# Patient Record
Sex: Female | Born: 1995 | Race: White | Hispanic: No | Marital: Single | Smoking: Never smoker
Health system: Southern US, Community
[De-identification: ages and names within clinical notes are randomized; demographics above are authoritative.]

---
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
## 2013-10-18 ENCOUNTER — Ambulatory Visit: Payer: Self-pay | Admitting: Family Medicine

## 2014-10-30 ENCOUNTER — Ambulatory Visit: Payer: Self-pay | Admitting: Family Medicine

## 2014-11-02 ENCOUNTER — Ambulatory Visit: Payer: Self-pay | Admitting: Family Medicine

## 2015-06-04 DIAGNOSIS — J029 Acute pharyngitis, unspecified: Secondary | ICD-10-CM | POA: Diagnosis not present

## 2015-08-20 DIAGNOSIS — R6884 Jaw pain: Secondary | ICD-10-CM | POA: Diagnosis not present

## 2015-09-25 DIAGNOSIS — M7581 Other shoulder lesions, right shoulder: Secondary | ICD-10-CM | POA: Diagnosis not present

## 2015-11-28 ENCOUNTER — Encounter: Payer: Self-pay | Admitting: *Deleted

## 2015-11-28 ENCOUNTER — Ambulatory Visit
Admission: EM | Admit: 2015-11-28 | Discharge: 2015-11-28 | Disposition: A | Discharge status: Home / Self Care | Payer: Managed Care, Other (non HMO) | Attending: Family Medicine | Admitting: Family Medicine

## 2015-11-28 DIAGNOSIS — M7541 Impingement syndrome of right shoulder: Secondary | ICD-10-CM | POA: Diagnosis not present

## 2015-11-28 MED ORDER — TRIAMCINOLONE ACETONIDE 40 MG/ML IJ SUSP: 40.0000 mg | Freq: Once | INTRAMUSCULAR | Status: DC

## 2015-11-28 MED ORDER — TRIAMCINOLONE ACETONIDE 40 MG/ML IJ SUSP
40.0000 mg | Freq: Once | INTRAMUSCULAR | Status: AC
  Administered 2015-11-28: 40 mg via INTRA_ARTICULAR

## 2015-11-28 NOTE — ED Notes (Signed)
Right shoulder pain, here for Dr Allena KatzPatel.

## 2015-11-28 NOTE — ED Provider Notes (Signed)
CSN: [id number]     Arrival date & time 11/28/15  1318 History   First MD Initiated Contact with Patient 11/28/15 1415     Chief Complaint  Patient presents with  . Shoulder Pain   (Consider location/radiation/quality/duration/timing/severity/associated sxs/prior Treatment) HPI: Patient presents today with right shoulder pain. [facility] with some right shoulder pain she thinks is due to increased catching. She denies any acute trauma or injury to the shoulder. She denies any tingling or numbness in the right shoulder. She has noticed some discomfort radiating into her pack recently from the increased catching she has been doing. She denies any bruising or swelling of the area. She is interested in getting a steroid injection today to see if she can continue to participate for the next 3 weeks of softball. She has tried Naprosyn and Motrin for her pain with some relief.   History reviewed. No pertinent past medical history. History reviewed. No pertinent past surgical history. History reviewed. No pertinent family history. Social History  Substance Use Topics  . Smoking status: Never Smoker   . Smokeless tobacco: None  . Alcohol Use: Yes   OB History    No data available     Review of Systems: Negative except mentioned above.   Allergies  Review of patient's allergies indicates no known allergies.  Home Medications   Prior to Admission medications   Medication Sig Start Date End Date Taking? Authorizing Provider  meloxicam (MOBIC) 15 MG tablet Take 15 mg by mouth daily.   Yes Historical Provider, MD   Meds Ordered and Administered this Visit   Medications  triamcinolone acetonide (KENALOG-40) injection 40 mg (40 mg Intra-articular Given 11/28/15 1444)    BP 116/68 mmHg  Pulse 62  Temp(Src) 98 F (36.7 C) (Oral)  Resp 16  Ht 5\' 7"  (1.702 m)  Wt 145 lb (65.772 kg)  BMI 22.71 kg/m2  SpO2 100%  LMP 10/28/2015 (Exact  Date) No data found.   Physical Exam   GENERAL: NAD RESP: CTA B CARD: RRR MSK: R Shoulder-mild subacromial tenderness, positive Neer's, positive Hawkins, pain mostly with forward flexion after 90, negative empty can, negative lift off, negative O'Brien's, negative apprehension, NV intact NEURO: CN II-XII grossly intact   ED Course  Procedures (including critical care time)  Labs Review Labs Reviewed - No data to display  Imaging Review No results found.   MDM   1. Impingement syndrome, shoulder, right    Risk/benefits were discussed. Patient consented verbally. Sterile technique was used. Area was cleaned with alcohol, ethyl chloride was used as local anesthetic, 5 cc of lidocaine without epi and 40 mg of Kenalog were used for injection. Posterior lateral approach was taken. Patient tolerated procedure well. Post injection instructions given. She will seek medical attention if she has any problems. I have asked that she not do any throwing or catching today or tomorrow. She can gradually return back to activity on Friday. She'll follow up with myself or [facility].    Jolene ProvostKirtida Cheetara Hoge, MD 11/28/15 [phone]

## 2015-12-27 ENCOUNTER — Ambulatory Visit (INDEPENDENT_AMBULATORY_CARE_PROVIDER_SITE_OTHER): Payer: Managed Care, Other (non HMO) | Admitting: Family Medicine

## 2015-12-27 DIAGNOSIS — M25561 Pain in right knee: Secondary | ICD-10-CM | POA: Diagnosis not present

## 2015-12-27 DIAGNOSIS — M25562 Pain in left knee: Secondary | ICD-10-CM
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
## 2015-12-28 ENCOUNTER — Ambulatory Visit
Admission: RE | Admit: 2015-12-28 | Discharge: 2015-12-28 | Disposition: A | Discharge status: Home / Self Care | Payer: Managed Care, Other (non HMO) | Source: Ambulatory Visit | Attending: Family Medicine | Admitting: Family Medicine

## 2015-12-28 DIAGNOSIS — M25462 Effusion, left knee: Secondary | ICD-10-CM | POA: Diagnosis not present

## 2015-12-28 DIAGNOSIS — M25561 Pain in right knee: Secondary | ICD-10-CM | POA: Insufficient documentation

## 2015-12-28 DIAGNOSIS — M25562 Pain in left knee: Principal | ICD-10-CM

## 2017-08-12 IMAGING — CR DG KNEE COMPLETE 4+V*R*
1 series · 4 of 4 positions shown · non-contrast
Comparison: None in PACs

CLINICAL DATA: Four years of intermittent bilateral anterior knee
pain and grinding; history of patellar tendinitis.

EXAM:
RIGHT KNEE - COMPLETE 4+ VIEW

[Series 1: dg knee complete 4 views right · 0.14mm/px · 4 of 4 slices shown]
[im 1/4]
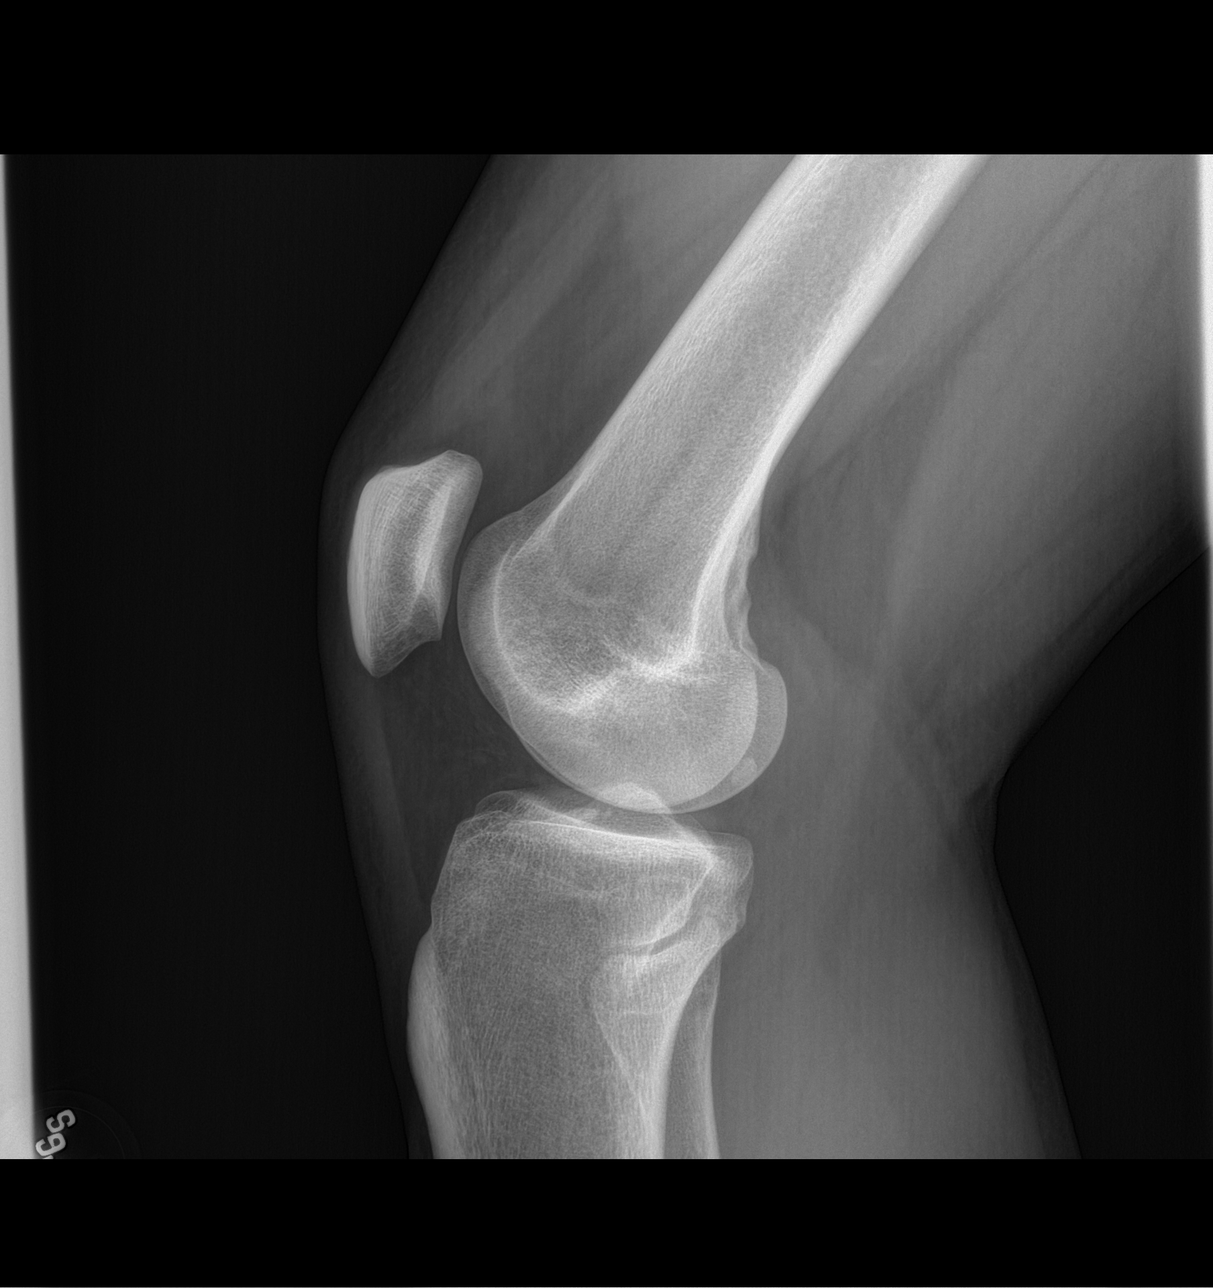
[im 2/4]
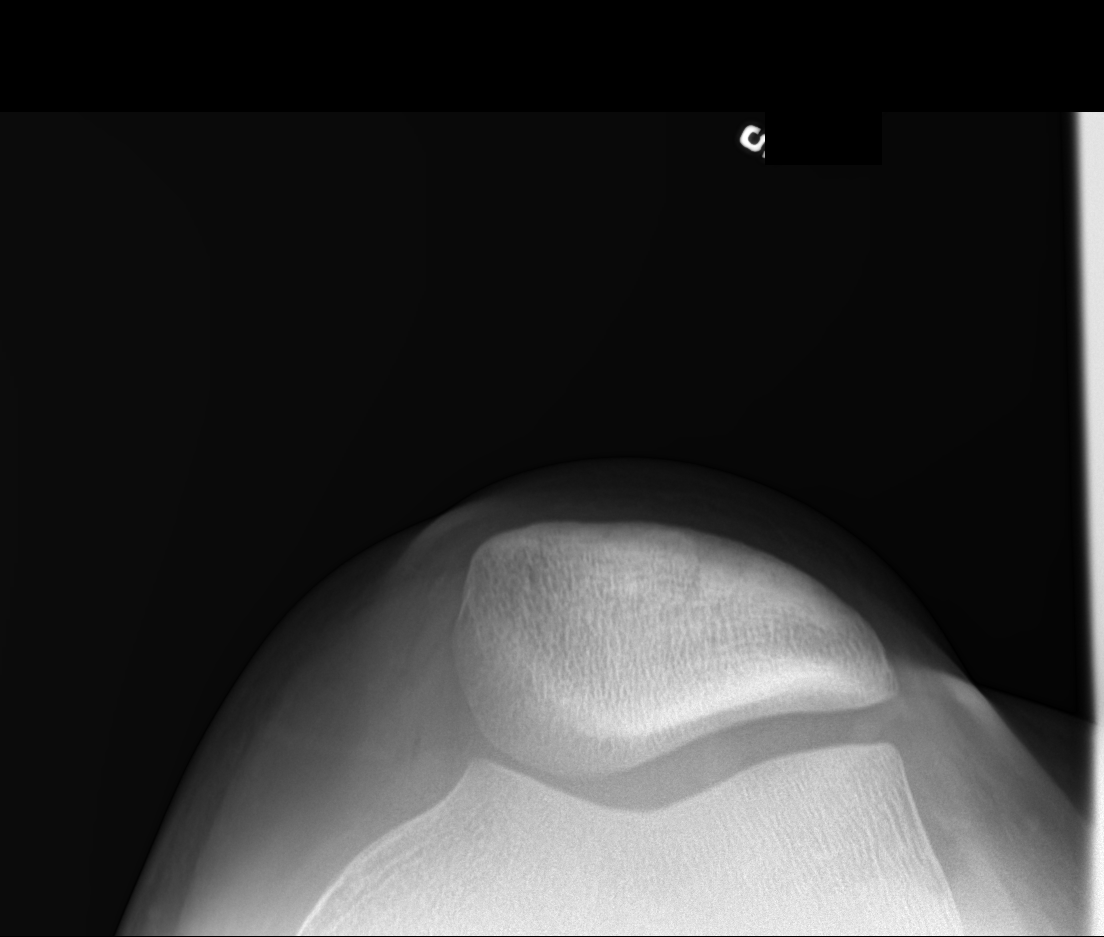
[im 3/4]
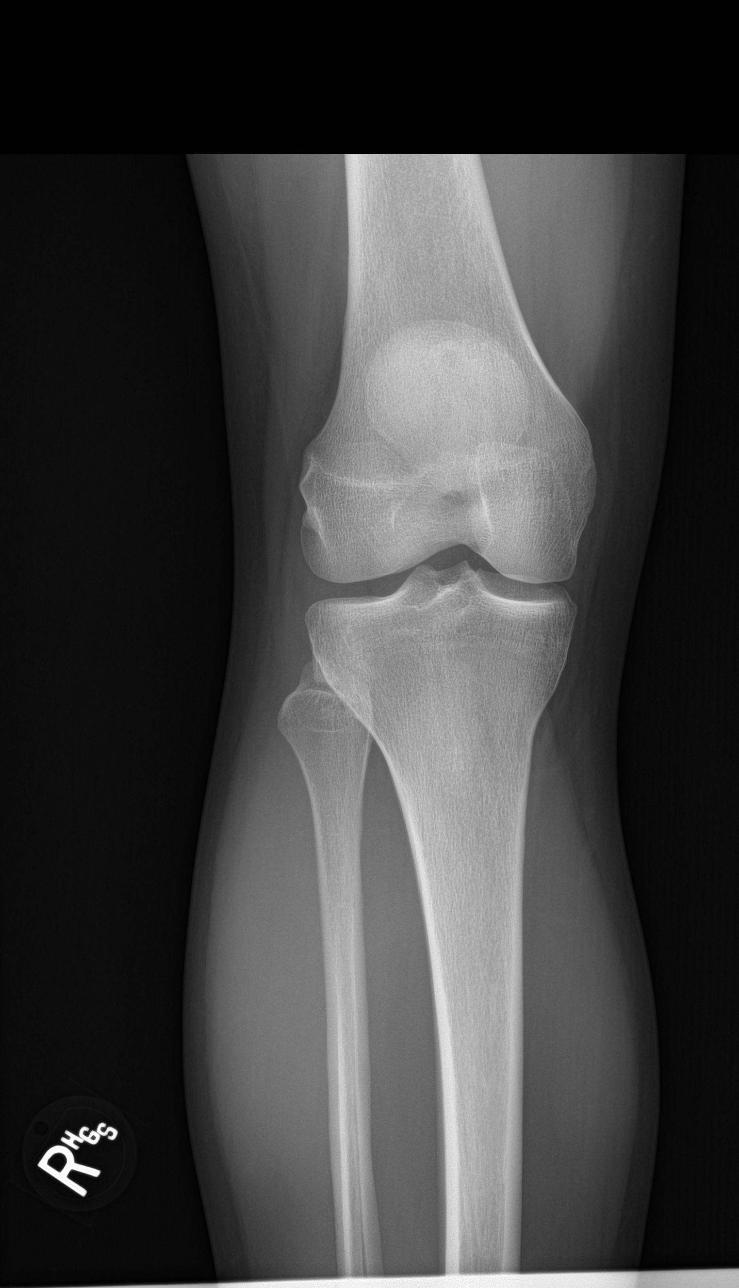
[im 4/4]
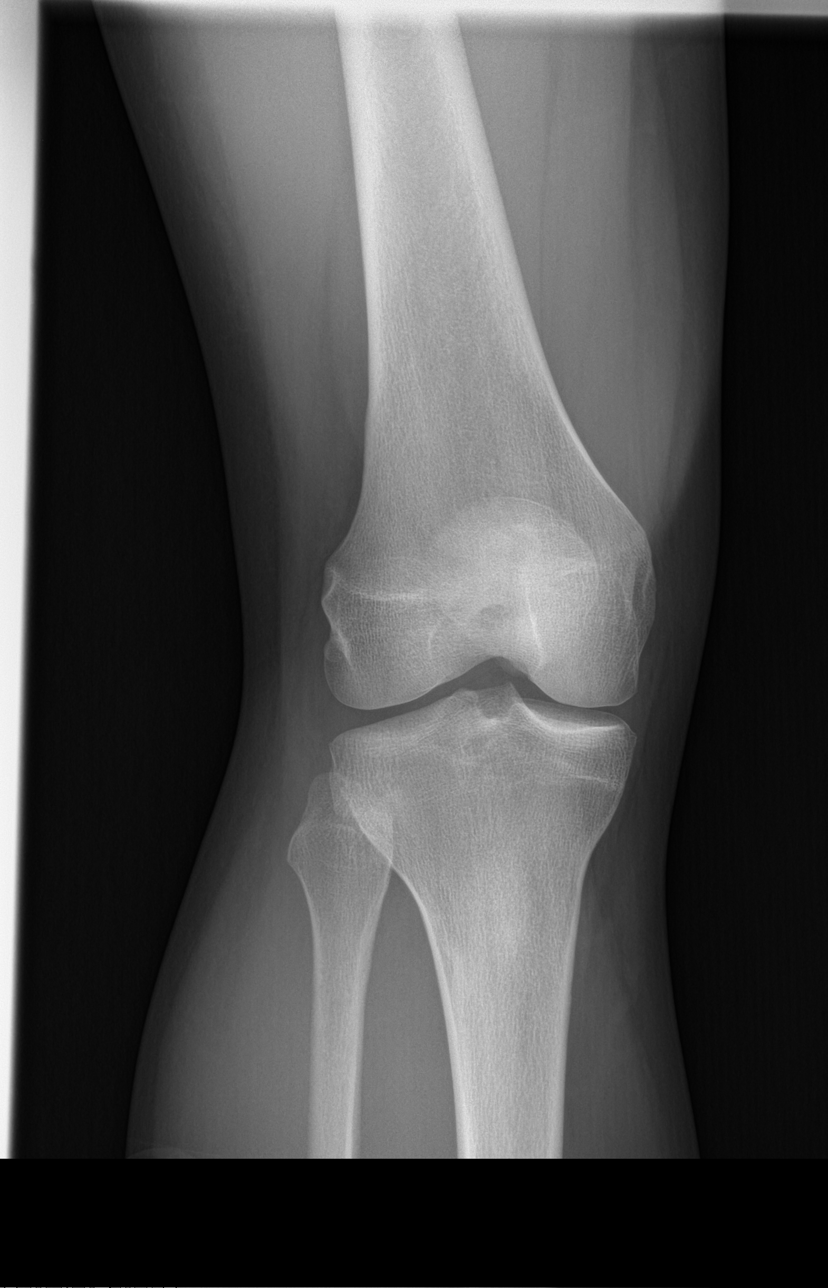

[4 of 4 positions shown; findings below may reference images not displayed]

FINDINGS: The bones are adequately mineralized. The joint spaces are
preserved. There is no acute or old fracture or dislocation. There
is no joint effusion or chondrocalcinosis. No significant osteophyte
formation is observed.
IMPRESSION: There is no acute or significant chronic bony abnormality of the
right knee.
